# Patient Record
Sex: Male | Born: 1935 | Race: White | Hispanic: No | Marital: Married
Health system: Southern US, Community
[De-identification: ages and names within clinical notes are randomized; demographics above are authoritative.]

---

## 2001-08-22 ENCOUNTER — Encounter: Payer: Self-pay | Admitting: Internal Medicine

## 2001-08-22 ENCOUNTER — Inpatient Hospital Stay (HOSPITAL_COMMUNITY): Admission: EM | Admit: 2001-08-22 | Discharge: 2001-09-02 | Payer: Self-pay | Admitting: Internal Medicine

## 2001-08-24 ENCOUNTER — Encounter: Payer: Self-pay | Admitting: Urology

## 2002-04-07 ENCOUNTER — Encounter: Payer: Self-pay | Admitting: Orthopedic Surgery

## 2002-04-07 ENCOUNTER — Inpatient Hospital Stay (HOSPITAL_COMMUNITY): Admission: EM | Admit: 2002-04-07 | Discharge: 2002-04-16 | Payer: Self-pay | Admitting: Emergency Medicine

## 2002-04-07 ENCOUNTER — Encounter: Payer: Self-pay | Admitting: Emergency Medicine

## 2002-05-30 ENCOUNTER — Observation Stay (HOSPITAL_COMMUNITY): Admission: AD | Admit: 2002-05-30 | Discharge: 2002-06-01 | Payer: Self-pay | Admitting: Internal Medicine

## 2002-05-30 ENCOUNTER — Encounter: Payer: Self-pay | Admitting: *Deleted

## 2002-05-31 ENCOUNTER — Encounter: Payer: Self-pay | Admitting: *Deleted

## 2002-05-31 ENCOUNTER — Encounter: Payer: Self-pay | Admitting: Family Medicine

## 2003-04-17 ENCOUNTER — Emergency Department (HOSPITAL_COMMUNITY): Admission: EM | Admit: 2003-04-17 | Discharge: 2003-04-17 | Payer: Self-pay | Admitting: *Deleted

## 2003-05-04 ENCOUNTER — Emergency Department (HOSPITAL_COMMUNITY): Admission: EM | Admit: 2003-05-04 | Discharge: 2003-05-04 | Payer: Self-pay | Admitting: Emergency Medicine

## 2003-05-20 ENCOUNTER — Inpatient Hospital Stay (HOSPITAL_COMMUNITY): Admission: EM | Admit: 2003-05-20 | Discharge: 2003-05-27 | Payer: Self-pay | Admitting: Emergency Medicine

## 2003-06-03 ENCOUNTER — Inpatient Hospital Stay (HOSPITAL_COMMUNITY): Admission: EM | Admit: 2003-06-03 | Discharge: 2003-06-12 | Payer: Self-pay | Admitting: Emergency Medicine

## 2003-07-05 ENCOUNTER — Inpatient Hospital Stay (HOSPITAL_COMMUNITY): Admission: EM | Admit: 2003-07-05 | Discharge: 2003-07-12 | Payer: Self-pay | Admitting: *Deleted

## 2003-08-29 ENCOUNTER — Emergency Department (HOSPITAL_COMMUNITY): Admission: EM | Admit: 2003-08-29 | Discharge: 2003-08-29 | Payer: Self-pay | Admitting: Emergency Medicine

## 2004-01-14 ENCOUNTER — Inpatient Hospital Stay (HOSPITAL_COMMUNITY): Admission: EM | Admit: 2004-01-14 | Discharge: 2004-01-20 | Payer: Self-pay | Admitting: *Deleted

## 2004-08-25 ENCOUNTER — Inpatient Hospital Stay (HOSPITAL_COMMUNITY): Admission: EM | Admit: 2004-08-25 | Discharge: 2004-09-01 | Payer: Self-pay | Admitting: Emergency Medicine

## 2006-06-23 IMAGING — CR DG CHEST 1V PORT
1 series · 1 of 1 positions shown · non-contrast
Comparison: none

CLINICAL DATA: Altered mental status. Possible pneumonia.
 PORTABLE [id number] VIEW:
 AP supine portable film of the chest made 08/30/04 at 0642 hours compared to a previous study of 08/27/04 at 4445 hours and shows poorer aeration of the bases.  There appears to be probably an acute infiltrate associated with the right base and there is some bilateral basilar atelectasis.  There is also increase in gaseous distention of the right colon.  Heart is minimally enlarged with calcification in the mitral valve annulus.  Aorta is minimally elongated and calcified.  There are calcified stable lymph nodes in the region of the right axilla. The bones show osteopenia.

[view not recorded]
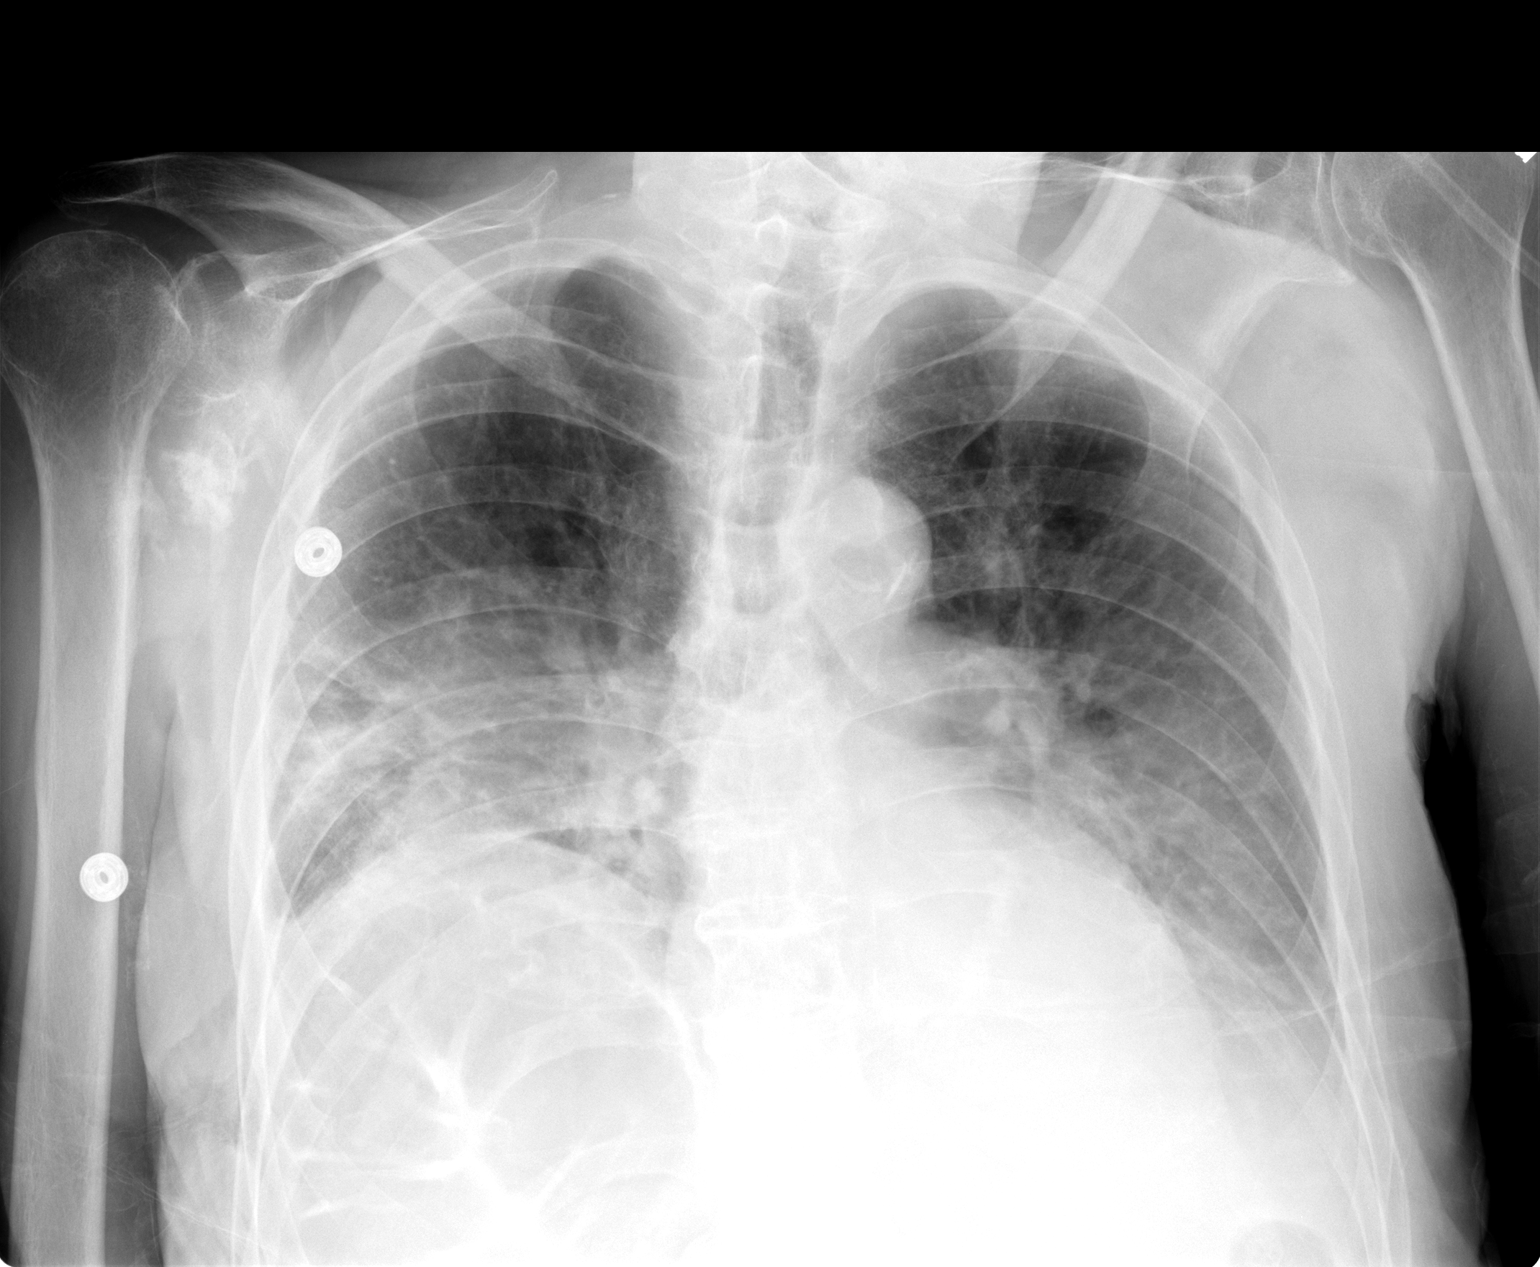

[1 of 1 positions shown; findings below may reference images not displayed]

IMPRESSION: Bilateral basilar atelectasis and/or infiltrates, greater on the right than the left with significant worsening compared to previous study of 08/27/04.

## 2013-09-19 ENCOUNTER — Encounter: Payer: Self-pay | Admitting: *Deleted

## 2013-09-19 NOTE — Telephone Encounter (Signed)
error
